# Patient Record
Sex: Male | Born: 1935 | Race: White | Hispanic: No | Marital: Married | Smoking: Never smoker
Health system: Southern US, Community
[De-identification: ages and names within clinical notes are randomized; demographics above are authoritative.]

## PROBLEM LIST (undated history)

## (undated) DIAGNOSIS — J45909 Unspecified asthma, uncomplicated: Secondary | ICD-10-CM

## (undated) DIAGNOSIS — I2510 Atherosclerotic heart disease of native coronary artery without angina pectoris: Secondary | ICD-10-CM

## (undated) DIAGNOSIS — I2119 ST elevation (STEMI) myocardial infarction involving other coronary artery of inferior wall: Secondary | ICD-10-CM

## (undated) DIAGNOSIS — K279 Peptic ulcer, site unspecified, unspecified as acute or chronic, without hemorrhage or perforation: Secondary | ICD-10-CM

## (undated) DIAGNOSIS — J984 Other disorders of lung: Secondary | ICD-10-CM

## (undated) DIAGNOSIS — G4730 Sleep apnea, unspecified: Secondary | ICD-10-CM

## (undated) DIAGNOSIS — M459 Ankylosing spondylitis of unspecified sites in spine: Secondary | ICD-10-CM

## (undated) DIAGNOSIS — C439 Malignant melanoma of skin, unspecified: Secondary | ICD-10-CM

## (undated) DIAGNOSIS — C9110 Chronic lymphocytic leukemia of B-cell type not having achieved remission: Secondary | ICD-10-CM

## (undated) DIAGNOSIS — I10 Essential (primary) hypertension: Secondary | ICD-10-CM

## (undated) DIAGNOSIS — I5030 Unspecified diastolic (congestive) heart failure: Secondary | ICD-10-CM

## (undated) HISTORY — DX: ST elevation (STEMI) myocardial infarction involving other coronary artery of inferior wall: I21.19

## (undated) HISTORY — DX: Ankylosing spondylitis of unspecified sites in spine: M45.9

## (undated) HISTORY — DX: Sleep apnea, unspecified: G47.30

## (undated) HISTORY — DX: Malignant melanoma of skin, unspecified: C43.9

## (undated) HISTORY — DX: Atherosclerotic heart disease of native coronary artery without angina pectoris: I25.10

## (undated) HISTORY — DX: Peptic ulcer, site unspecified, unspecified as acute or chronic, without hemorrhage or perforation: K27.9

## (undated) HISTORY — DX: Chronic lymphocytic leukemia of B-cell type not having achieved remission: C91.10

## (undated) HISTORY — DX: Unspecified diastolic (congestive) heart failure: I50.30

## (undated) HISTORY — DX: Other disorders of lung: J98.4

## (undated) HISTORY — DX: Unspecified asthma, uncomplicated: J45.909

## (undated) HISTORY — DX: Essential (primary) hypertension: I10

---

## 2010-04-20 ENCOUNTER — Inpatient Hospital Stay (HOSPITAL_COMMUNITY): Admission: EM | Admit: 2010-04-20 | Discharge: 2010-04-21 | Payer: Self-pay | Admitting: Emergency Medicine

## 2010-11-11 LAB — COMPREHENSIVE METABOLIC PANEL
ALT: 50 U/L (ref 0–53)
AST: 46 U/L — ABNORMAL HIGH (ref 0–37)
Albumin: 3.8 g/dL (ref 3.5–5.2)
BUN: 10 mg/dL (ref 6–23)
Calcium: 8.5 mg/dL (ref 8.4–10.5)
GFR calc non Af Amer: >60 mL/min (ref >60)
Sodium: 136 mEq/L (ref 135–145)

## 2010-11-11 LAB — TROPONIN I: Troponin I: 0.03 ng/mL (ref 0.00–0.06)

## 2010-11-11 LAB — CBC
Hemoglobin: 15.9 g/dL (ref 13.0–17.0)
MCH: 33.1 pg (ref 26.0–34.0)
MCV: 96.4 fL (ref 78.0–100.0)
MCV: 98.3 fL (ref 78.0–100.0)
Platelets: 155 10*3/uL (ref 150–400)
Platelets: 161 10*3/uL (ref 150–400)
RBC: 4.81 MIL/uL (ref 4.22–5.81)
RDW: 13.7 % (ref 11.5–15.5)
WBC: 45.1 10*3/uL — ABNORMAL HIGH (ref 4.0–10.5)

## 2010-11-11 LAB — DIFFERENTIAL
Basophils Absolute: 0.6 10*3/uL — ABNORMAL HIGH (ref 0.0–0.1)
Basophils Relative: 2 % — ABNORMAL HIGH (ref 0–1)
Eosinophils Absolute: 0.3 10*3/uL (ref 0.0–0.7)
Eosinophils Relative: 1 % (ref 0–5)
Lymphocytes Relative: 71 % — ABNORMAL HIGH (ref 12–46)
Monocytes Relative: 5 % (ref 3–12)

## 2010-11-11 LAB — PROTIME-INR
INR: 1.04 (ref 0.00–1.49)
Prothrombin Time: 13.8 seconds (ref 11.6–15.2)

## 2010-11-11 LAB — URINALYSIS, ROUTINE W REFLEX MICROSCOPIC
Bilirubin Urine: NEGATIVE
Nitrite: NEGATIVE
Specific Gravity, Urine: 1.038 — ABNORMAL HIGH (ref 1.005–1.030)
Urobilinogen, UA: 0.2 mg/dL (ref 0.0–1.0)
pH: 7 (ref 5.0–8.0)

## 2010-11-11 LAB — POCT CARDIAC MARKERS
CKMB, poc: 1.1 ng/mL (ref 1.0–8.0)
CKMB, poc: <1 ng/mL — ABNORMAL LOW (ref 1.0–8.0)
Myoglobin, poc: 59.1 ng/mL (ref 12–200)

## 2010-11-11 LAB — LIPASE, BLOOD: Lipase: 28 U/L (ref 11–59)

## 2010-11-11 LAB — CARDIAC PANEL(CRET KIN+CKTOT+MB+TROPI)
CK, MB: 3.1 ng/mL (ref 0.3–4.0)
Relative Index: 2.6 — ABNORMAL HIGH (ref 0.0–2.5)

## 2010-11-11 LAB — CK TOTAL AND CKMB (NOT AT ARMC): Total CK: 89 U/L (ref 7–232)

## 2011-08-22 IMAGING — CT CT ANGIO CHEST
2 of 6 series · 19 of 36 positions shown · IV contrast (APPLIED)
Comparison: None available

CLINICAL DATA: Chest pain

CT ANGIOGRAPHY CHEST WITH CONTRAST
TECHNIQUE: Multidetector CT imaging of the chest was performed
using the standard protocol during bolus administration of
intravenous contrast.  Multiplanar CT image reconstructions
including MIPs were obtained to evaluate the vascular anatomy.
Contrast:  100 ml of omni 300

[Series 9: pulm embolism 1.0 b25f thins · axial · 0.79mm/px · z∈[+1155,+1387]mm · 18 of 258 slices shown]
[im 13/258  lung]
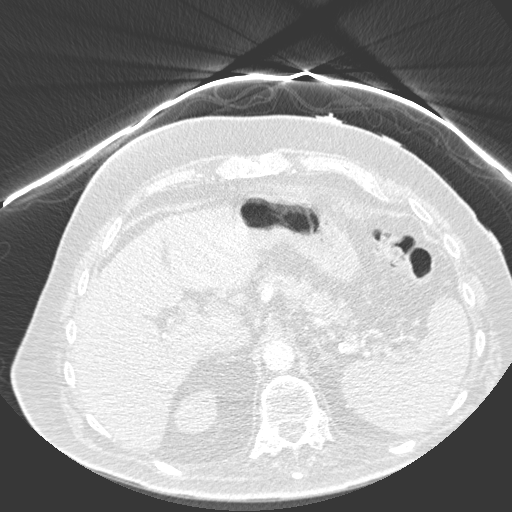
[im 26/258  mediastinal]
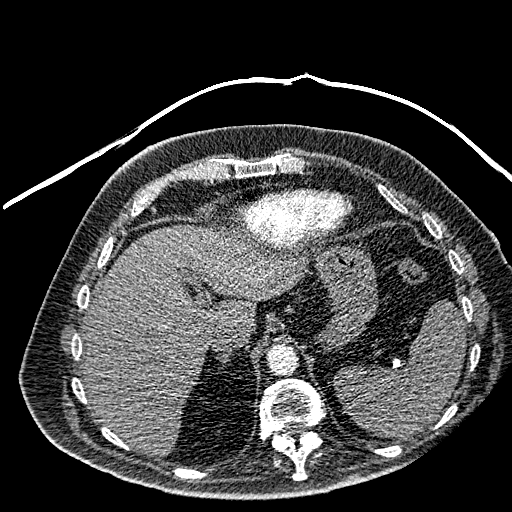
[im 39/258  lung]
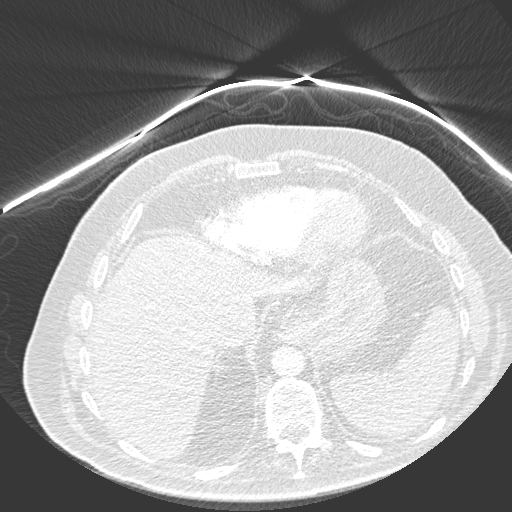
[im 52/258  mediastinal]
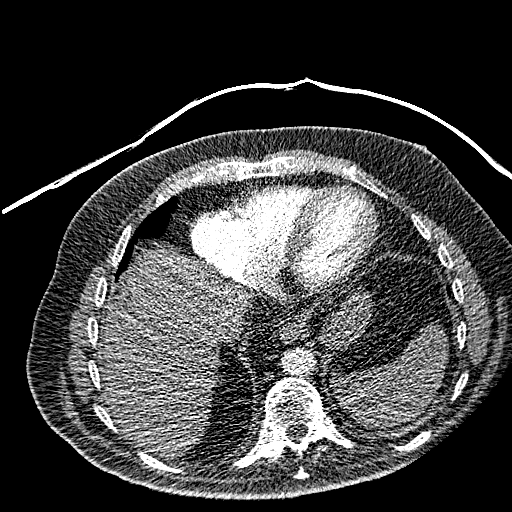
[im 65/258  lung]
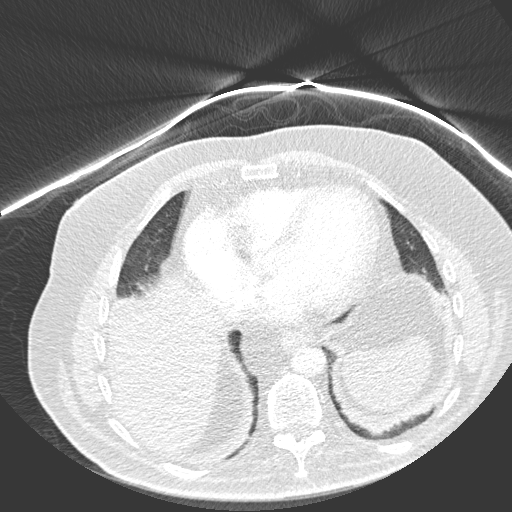
[im 78/258  mediastinal]
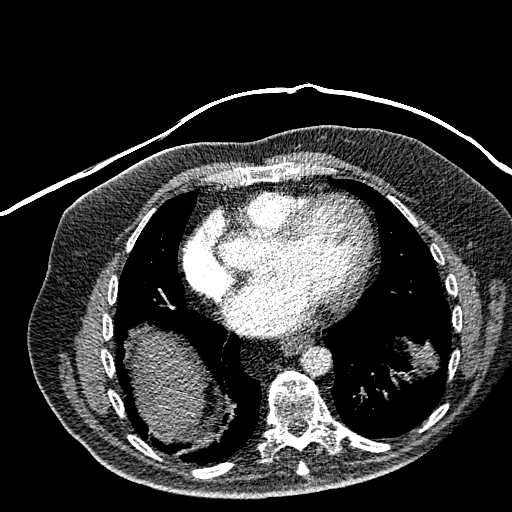
[im 90/258  lung]
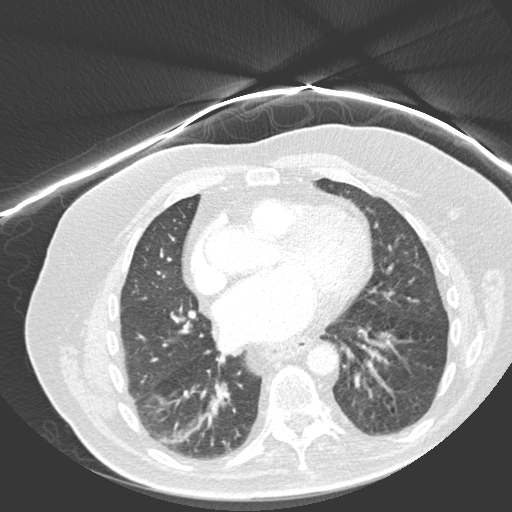
[im 103/258  mediastinal]
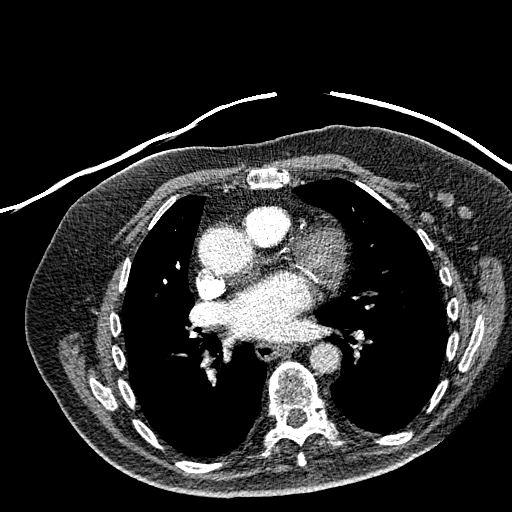
[im 116/258  lung]
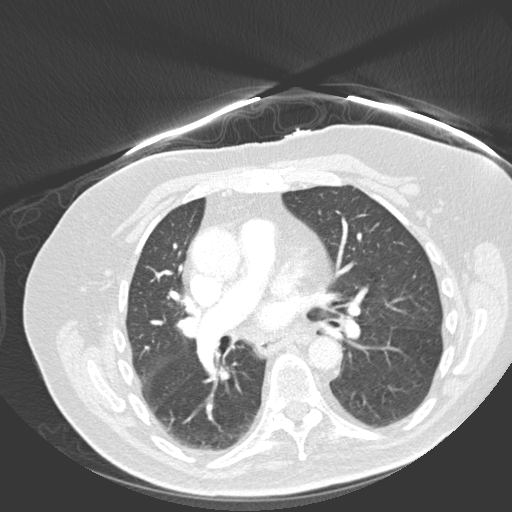
[im 142/258  mediastinal]
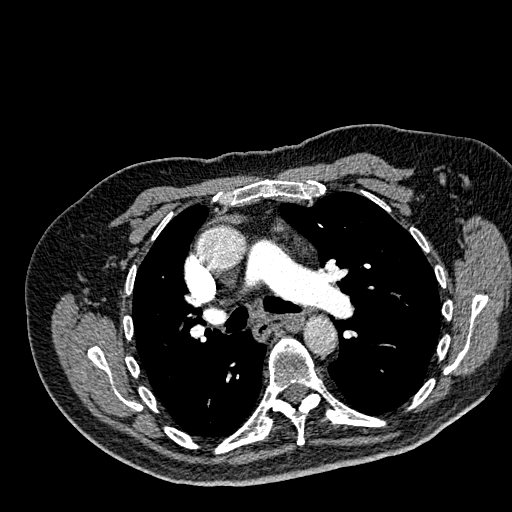
[im 155/258  lung]
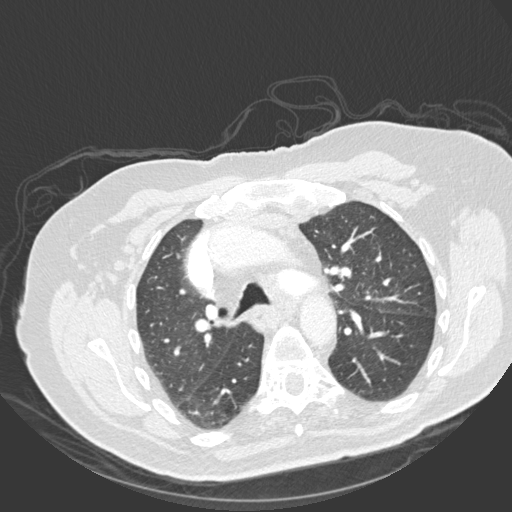
[im 168/258  mediastinal]
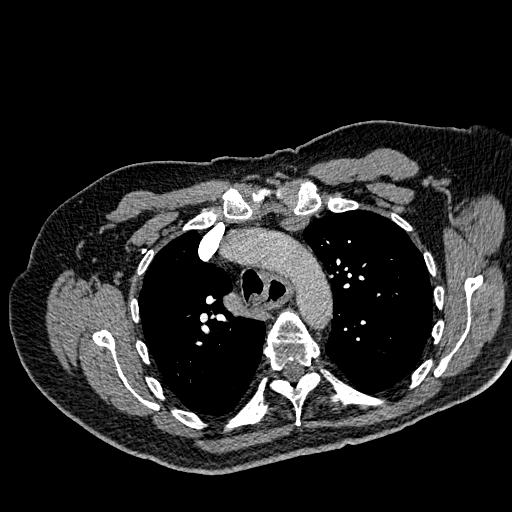
[im 180/258  lung]
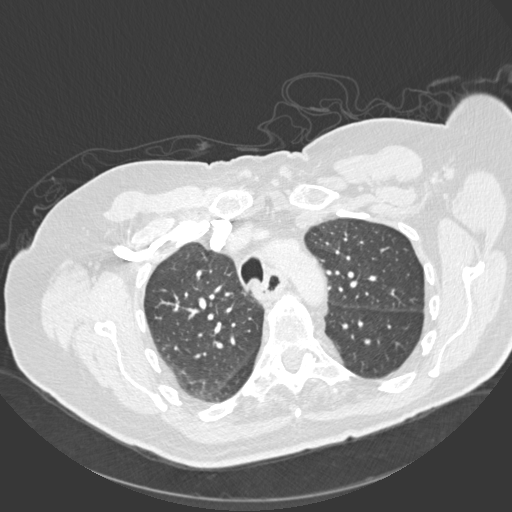
[im 193/258  mediastinal]
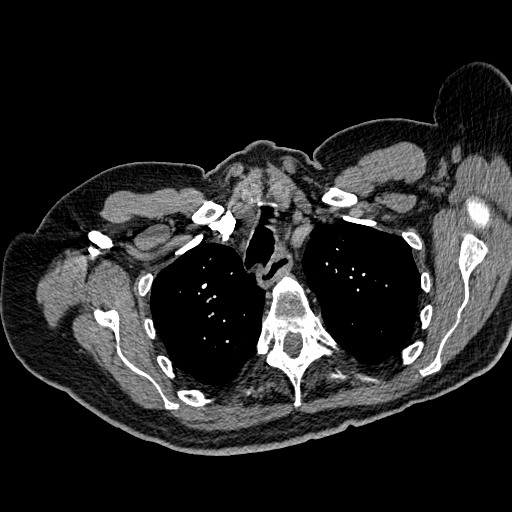
[im 206/258  lung]
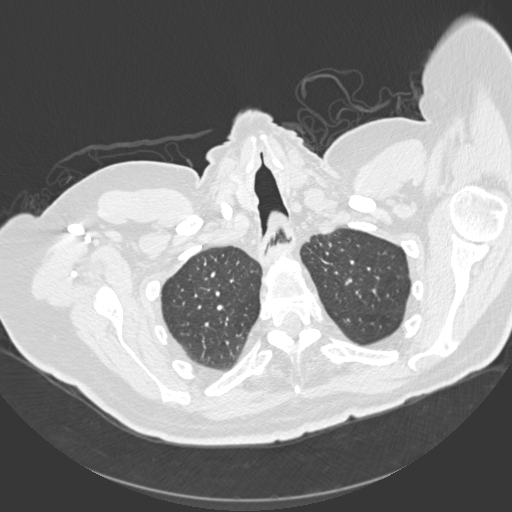
[im 219/258  mediastinal]
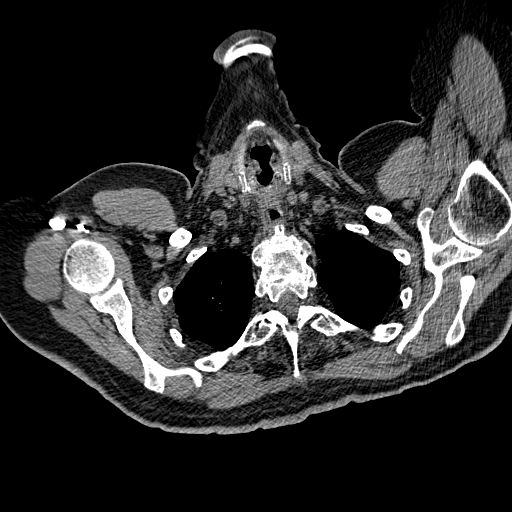
[im 232/258  lung]
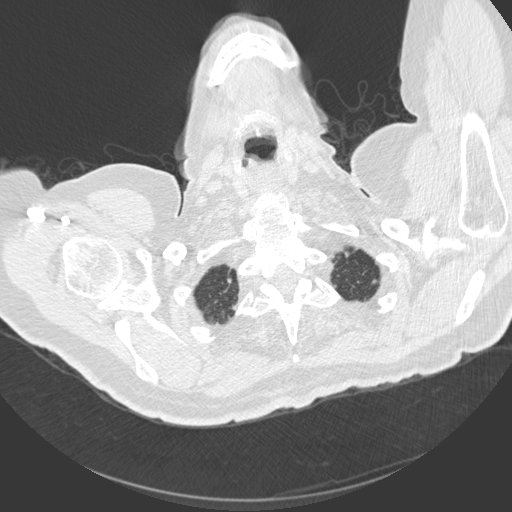
[im 245/258  mediastinal]
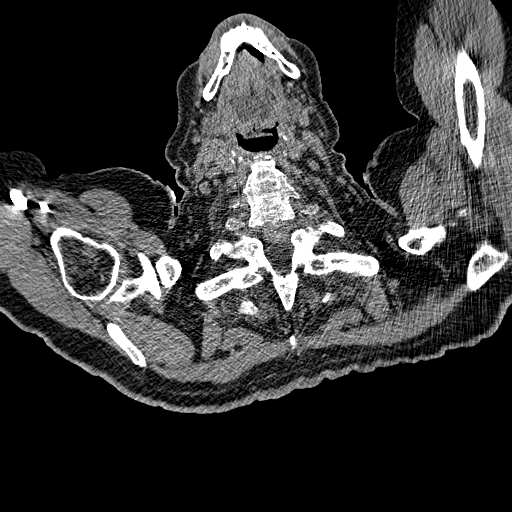

[Series 604: coronal · coronal · 0.79mm/px · 1 of 81 slices shown]
[im 41/81  mediastinal]
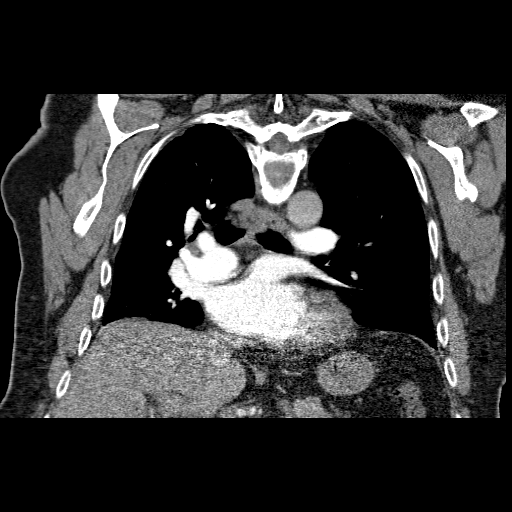

[19 of 36 positions shown; findings below may reference images not displayed]

FINDINGS: There are multiple bilateral supraclavicular and axillary lymph
nodes.  Some of these appear borderline enlarged.  For instance.
Within the left axilla there is a lymph node which measures 1 cm,
image number 50.  Left supraclavicular lymph node measures 1.1 cm,
image number 19.

No significant mediastinal or hilar adenopathy.

No pericardial or pleural effusions identified.

The lower lobe pulmonary arteries are partially obscured by
respiratory motion artifact.

Within this limitation there are no abnormal filling defects to
suggest an acute pulmonary embolus.

Mild plate-like atelectasis is noted within both lung bases.

No airspace consolidation identified.

Review of the visualized osseous structures shows no aggressive
bone lesions.

Anterior ankylosis of the thoracic spine is noted.

The thoracic esophagus appears dilated and patulous.

Review of the MIP images confirms the above findings.
IMPRESSION: 1.  Examination limited by mild respiratory motion artifact within
the lower lobes.  Within this limitation no evidence for acute
embolus noted.
2.  There are borderline enlarged bilateral axillary and
supraclavicular adenopathy.  Findings may be reactive or
inflammatory etiology.  Less favored is metastatic adenopathy or
lymphoma.  Granulomatous inflammation or infection is not excluded.
3.  Dilated and patulous esophagus and ankylosis of the thoracic
spine.  Correlate for any clinical signs or symptoms of connective
tissue disorder.

## 2013-06-05 ENCOUNTER — Encounter: Payer: Self-pay | Admitting: Interventional Cardiology

## 2013-06-06 ENCOUNTER — Ambulatory Visit (INDEPENDENT_AMBULATORY_CARE_PROVIDER_SITE_OTHER): Payer: Medicare Other | Admitting: Interventional Cardiology

## 2013-06-06 ENCOUNTER — Encounter: Payer: Self-pay | Admitting: Interventional Cardiology

## 2013-06-06 ENCOUNTER — Encounter (INDEPENDENT_AMBULATORY_CARE_PROVIDER_SITE_OTHER): Payer: Self-pay

## 2013-06-06 VITALS — BP 148/80 | Ht 70.0 in | Wt 166.0 lb

## 2013-06-06 DIAGNOSIS — I2510 Atherosclerotic heart disease of native coronary artery without angina pectoris: Secondary | ICD-10-CM | POA: Insufficient documentation

## 2013-06-06 DIAGNOSIS — I5032 Chronic diastolic (congestive) heart failure: Secondary | ICD-10-CM | POA: Insufficient documentation

## 2013-06-06 NOTE — Progress Notes (Signed)
Patient ID: Bryan Zavala, male   DOB: 01/10/1927, 79 y.o.   MRN: [id number]    HPI [name] is doing well. He has not had any significant angina or requirement for nitroglycerin. He has no physical limitations. Other than those imposed by his vision. He denies edema. There is no orthopnea. His physical activity is limited.  Allergies  Allergen Reactions  . Sulfur Rash    Current Outpatient Prescriptions  Medication Sig Dispense Refill  . aspirin 81 MG tablet Take 81 mg by mouth daily.      Marland Kitchen atorvastatin (LIPITOR) 10 MG tablet Take 10 mg by mouth daily. 1/2 tablet po Daily.      . fish oil-omega-3 fatty acids 1000 MG capsule Take 1,200 mg by mouth daily.      . fluticasone (FLONASE) 50 MCG/ACT nasal spray Place 2 sprays into the nose as needed.       . gabapentin (NEURONTIN) 100 MG capsule Take 100 mg by mouth 3 (three) times daily.       Marland Kitchen ibrutinib (IMBRUVICA) 140 MG capsul 140 mg. 3 tablet po daily      . lactulose (CHRONULAC) 10 GM/15ML solution Take 10 g by mouth as needed.       Marland Kitchen losartan (COZAAR) 50 MG tablet Take 50 mg by mouth daily.      . Multiple Vitamin (MULTIVITAMIN) tablet Take 1 tablet by mouth daily.      . nitroGLYCERIN (NITROSTAT) 0.4 MG SL tablet Place 0.4 mg under the tongue every 5 (five) minutes as needed for chest pain.      . potassium chloride (MICRO-K) 10 MEQ CR capsule Take 10 mEq by mouth 2 (two) times daily.       . solifenacin (VESICARE) 5 MG tablet Take 5 mg by mouth as needed.       Marland Kitchen VIGAMOX 0.5 % ophthalmic solution Place 1 drop into the left eye 3 (three) times daily.       . vitamin D, CHOLECALCIFEROL, 400 UNITS tablet Take 400 Units by mouth daily.       No current facility-administered medications for this visit.    Past Medical History  Diagnosis Date  . CLL (chronic lymphocytic leukemia)   . Malignant melanoma   . Coronary artery disease   . Inferior MI   . Hypertension   . Ankylosing spondylitis   . Restrictive lung disease   .  Diastolic heart failure   . Sleep apnea   . Asthma   . PUD (peptic ulcer disease)     History reviewed. No pertinent past surgical history.  ROS: Denies orthopnea, PND, and chest pain. PHYSICAL EXAM BP 148/80  Ht 5\' 10"  (1.778 m)  Wt 166 lb (75.297 kg)  BMI 23.82 kg/m2 Significant conjunctival erythema. Left eye. Chest is clear. No JVD. Normal. Cardiac exam without murmur or gallop.  No edema. Neuro exam is unremarkable except. Significant decrease in vision.  EKG: Normal sinus rhythm. Evidence of old inferior infarction.  ASSESSMENT AND PLAN  1. Coronary artery disease, stable without angina.  2. Diastolic heart failure, well compensated.  3. Blood pressure is under good control.  No significant change and cardiovascular therapy as needed. The patient is cleared for upcoming ophthalmologic surgery. I will plan to see him back in one year.

## 2013-06-06 NOTE — Patient Instructions (Signed)
Your physician recommends that you continue on your current medications as directed. Please refer to the Current Medication list given to you today.  Your physician recommends that you schedule a follow-up appointment in: 1 YEAR FOLLOW UP  

## 2013-12-26 DEATH — deceased

## 2014-04-28 ENCOUNTER — Telehealth: Payer: Self-pay

## 2014-04-28 NOTE — Telephone Encounter (Signed)
Patient died @ [facility] per [employer]
